# Patient Record
Sex: Male | Born: 1970 | Race: Black or African American | Hispanic: No | Marital: Single
Health system: Southern US, Community
[De-identification: ages and names within clinical notes are randomized; demographics above are authoritative.]

---

## 2003-10-26 ENCOUNTER — Emergency Department (HOSPITAL_COMMUNITY): Admission: EM | Admit: 2003-10-26 | Discharge: 2003-10-26 | Payer: Self-pay | Admitting: Emergency Medicine

## 2003-11-07 ENCOUNTER — Ambulatory Visit (HOSPITAL_COMMUNITY): Admission: RE | Admit: 2003-11-07 | Discharge: 2003-11-07 | Payer: Self-pay | Admitting: Emergency Medicine

## 2007-06-01 ENCOUNTER — Emergency Department (HOSPITAL_COMMUNITY): Admission: EM | Admit: 2007-06-01 | Discharge: 2007-06-01 | Payer: Self-pay | Admitting: General Surgery

## 2008-02-22 IMAGING — CR DG CHEST 2V
2 series · 2 of 2 positions shown · non-contrast
Comparison: none

CLINICAL DATA: Chest pain and short of breath.  Acute pancreatitis.  Hypertension.  
 CHEST - 2 VIEW:

[view not recorded (1 of 2)]
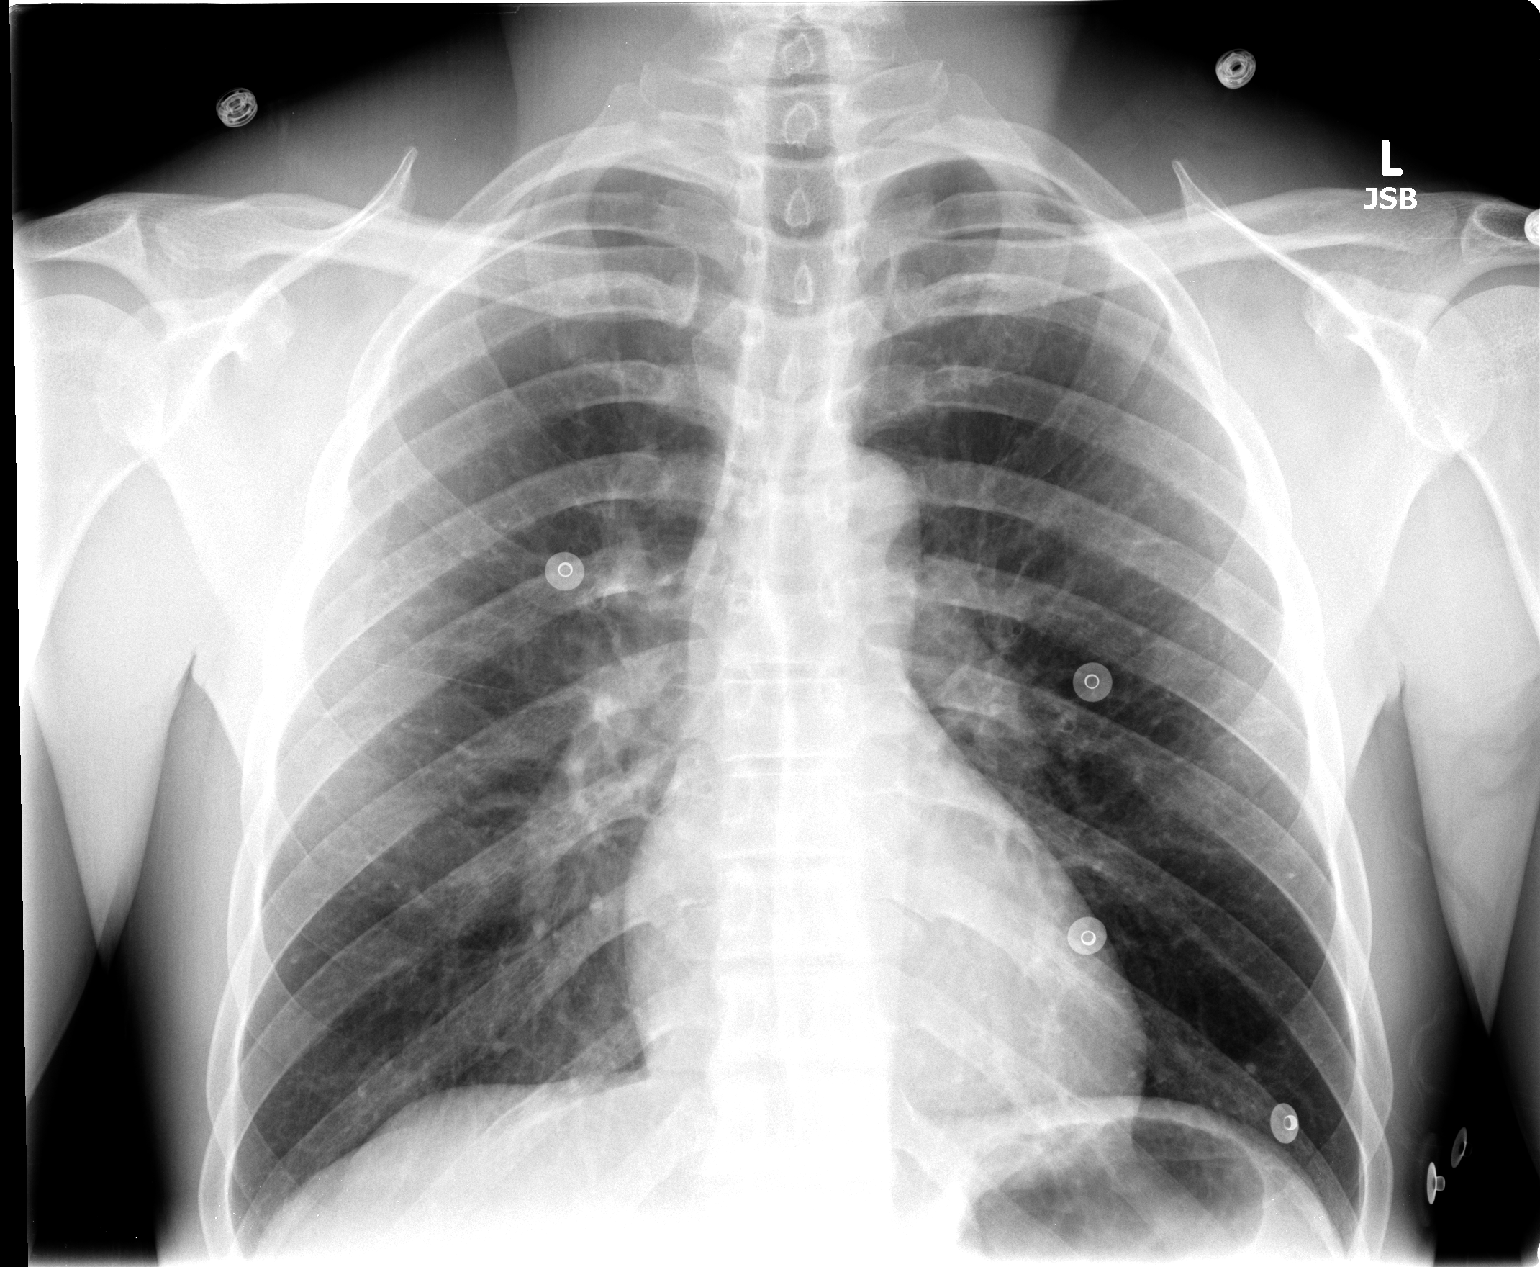

[view not recorded (2 of 2)]
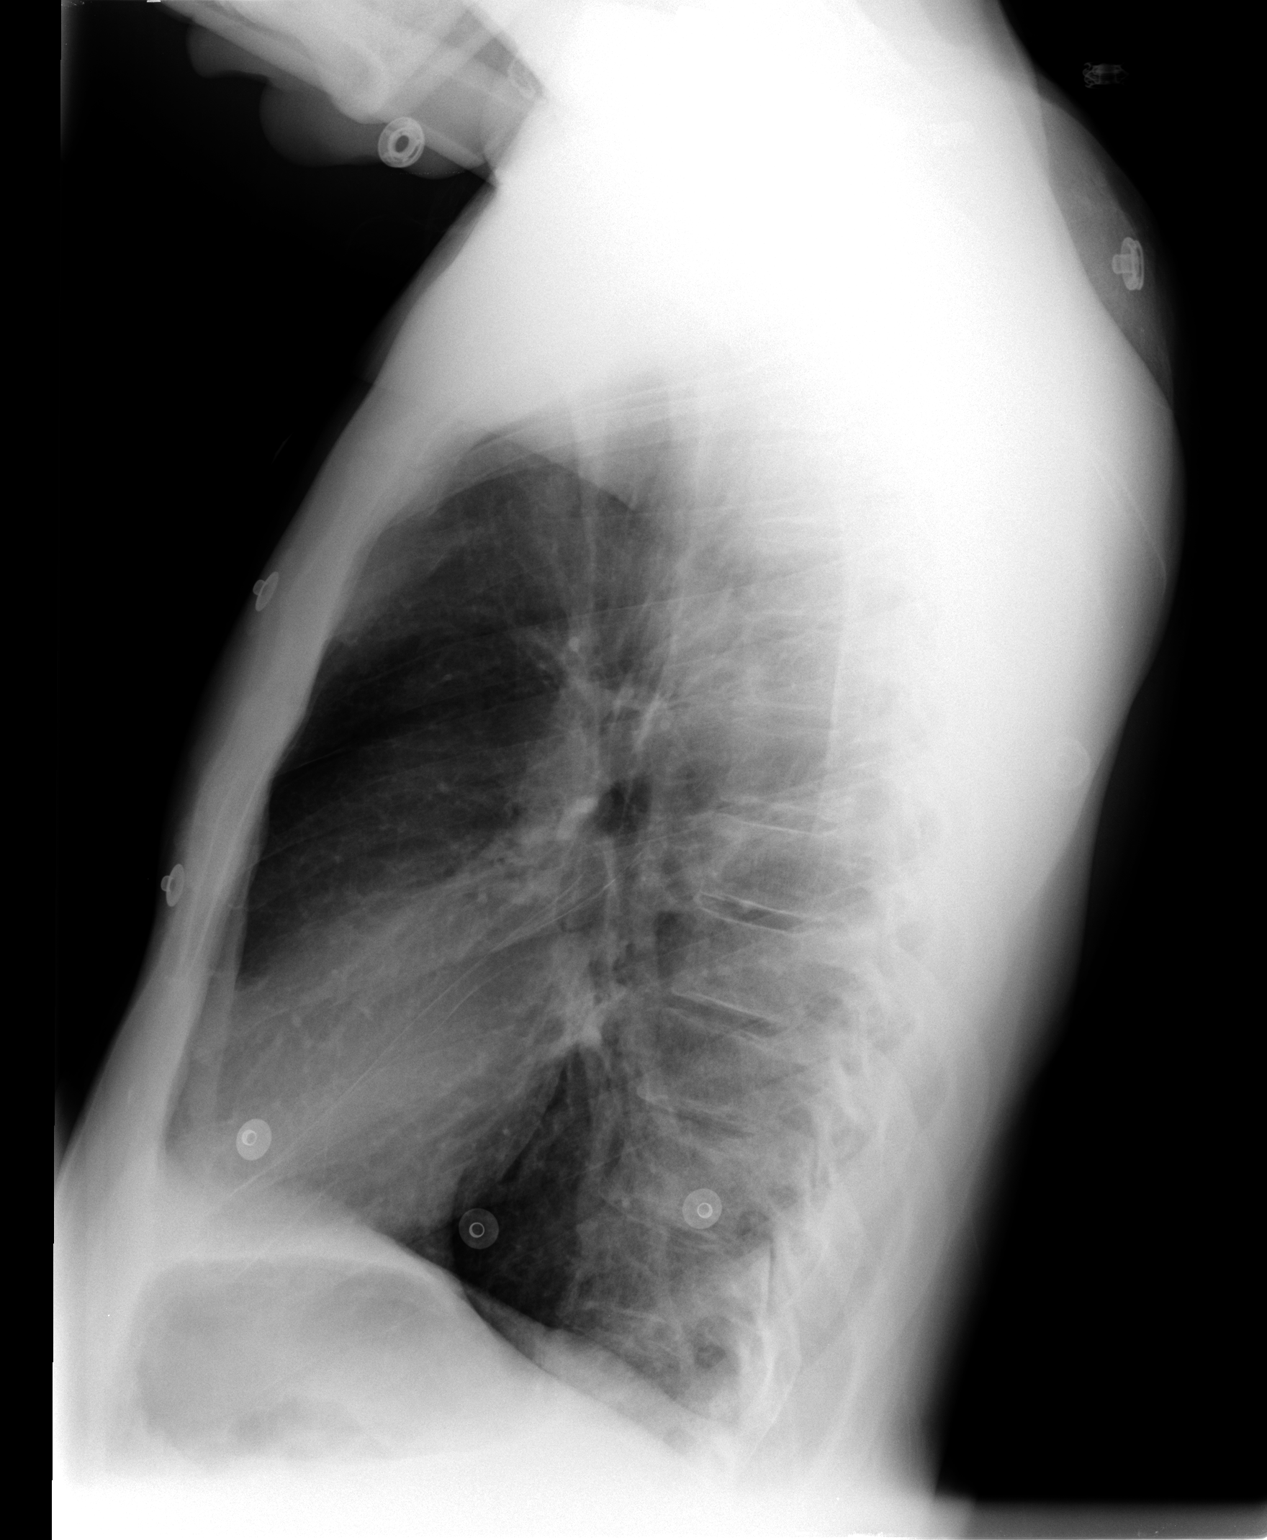

[2 of 2 positions shown; findings below may reference images not displayed]

FINDINGS: Heart size is normal.  The mediastinum is unremarkable.  The lungs are clear.  No effusions.  No soft tissue or bony abnormality is seen.
IMPRESSION: No active disease.

## 2011-01-14 ENCOUNTER — Emergency Department (HOSPITAL_COMMUNITY)
Admission: EM | Admit: 2011-01-14 | Discharge: 2011-01-14 | Disposition: A | Discharge status: Home / Self Care | Payer: Self-pay | Attending: Emergency Medicine | Admitting: Emergency Medicine

## 2011-01-14 DIAGNOSIS — R21 Rash and other nonspecific skin eruption: Secondary | ICD-10-CM | POA: Insufficient documentation

## 2011-01-14 DIAGNOSIS — L02419 Cutaneous abscess of limb, unspecified: Secondary | ICD-10-CM | POA: Insufficient documentation

## 2011-07-05 LAB — POCT CARDIAC MARKERS
Myoglobin, poc: 60.5
Operator id: 151321
Troponin i, poc: <0.05

## 2011-07-05 LAB — I-STAT 8, (EC8 V) (CONVERTED LAB)
Acid-Base Excess: 4 — ABNORMAL HIGH
Bicarbonate: 28.7 — ABNORMAL HIGH
HCT: 42
Hemoglobin: 14.3
Operator id: 151321
Sodium: 140
pCO2, Ven: 42.9 — ABNORMAL LOW

## 2018-10-01 ENCOUNTER — Emergency Department (HOSPITAL_COMMUNITY)
Admission: EM | Admit: 2018-10-01 | Discharge: 2018-10-01 | Disposition: A | Discharge status: Home / Self Care | Payer: Self-pay | Attending: Emergency Medicine | Admitting: Emergency Medicine

## 2018-10-01 ENCOUNTER — Emergency Department (HOSPITAL_COMMUNITY): Payer: Self-pay

## 2018-10-01 ENCOUNTER — Encounter (HOSPITAL_COMMUNITY): Payer: Self-pay | Admitting: Emergency Medicine

## 2018-10-01 DIAGNOSIS — R1013 Epigastric pain: Secondary | ICD-10-CM

## 2018-10-01 DIAGNOSIS — K852 Alcohol induced acute pancreatitis without necrosis or infection: Secondary | ICD-10-CM | POA: Insufficient documentation

## 2018-10-01 LAB — CBC
HEMATOCRIT: 44.5 % (ref 39.0–52.0)
Hemoglobin: 13.8 g/dL (ref 13.0–17.0)
MCH: 26.1 pg (ref 26.0–34.0)
MCHC: 31 g/dL (ref 30.0–36.0)
MCV: 84.1 fL (ref 80.0–100.0)
Platelets: 286 10*3/uL (ref 150–400)
RBC: 5.29 MIL/uL (ref 4.22–5.81)
RDW: 14.8 % (ref 11.5–15.5)
WBC: 8.6 10*3/uL (ref 4.0–10.5)
nRBC: 0 % (ref 0.0–0.2)

## 2018-10-01 LAB — URINALYSIS, ROUTINE W REFLEX MICROSCOPIC
BILIRUBIN URINE: NEGATIVE
GLUCOSE, UA: NEGATIVE mg/dL
HGB URINE DIPSTICK: NEGATIVE
Ketones, ur: NEGATIVE mg/dL
Leukocytes, UA: NEGATIVE
Nitrite: NEGATIVE
PH: 7 (ref 5.0–8.0)
Protein, ur: NEGATIVE mg/dL
Specific Gravity, Urine: 1.021 (ref 1.005–1.030)

## 2018-10-01 LAB — COMPREHENSIVE METABOLIC PANEL
ALK PHOS: 47 U/L (ref 38–126)
ALT: 11 U/L (ref 0–44)
AST: 16 U/L (ref 15–41)
Albumin: 3.5 g/dL (ref 3.5–5.0)
Anion gap: 10 (ref 5–15)
BUN: 17 mg/dL (ref 6–20)
CHLORIDE: 106 mmol/L (ref 98–111)
CO2: 24 mmol/L (ref 22–32)
CREATININE: 1.14 mg/dL (ref 0.61–1.24)
Calcium: 9.2 mg/dL (ref 8.9–10.3)
GFR calc Af Amer: >60 mL/min (ref >60)
Glucose, Bld: 124 mg/dL — ABNORMAL HIGH (ref 70–99)
Potassium: 3.7 mmol/L (ref 3.5–5.1)
SODIUM: 140 mmol/L (ref 135–145)
TOTAL PROTEIN: 6.7 g/dL (ref 6.5–8.1)
Total Bilirubin: 0.6 mg/dL (ref 0.3–1.2)

## 2018-10-01 LAB — LIPASE, BLOOD: LIPASE: 97 U/L — AB (ref 11–51)

## 2018-10-01 LAB — POC OCCULT BLOOD, ED: FECAL OCCULT BLD: NEGATIVE

## 2018-10-01 MED ORDER — FAMOTIDINE 20 MG PO TABS
40.0000 mg | ORAL_TABLET | Freq: Once | ORAL | Status: AC
  Administered 2018-10-01: 40 mg via ORAL
  Filled 2018-10-01: qty 2

## 2018-10-01 MED ORDER — OMEPRAZOLE 20 MG PO CPDR: 20.0000 mg | DELAYED_RELEASE_CAPSULE | Freq: Every day | ORAL | 1 refills | Status: AC

## 2018-10-01 NOTE — Discharge Instructions (Signed)
1.  You must avoid all alcohol when you have pancreatitis.  This is been the most common causes of pancreatitis.  Follow the instructions in your discharge instructions regarding diet. 2.  Take omeprazole (Prilosec) daily for the next month. 3.  It is very important that you schedule a follow-up appointment with a family doctor for reassessment.  You may go to [facility] and wellness and fill out new patient paperwork.  You may call the referral number in your discharge instructions to help find a physician.  You MUST follow-up because it needs to be confirmed that your symptoms are going away and improving with treatment.  You may need further evaluation such as upper endoscopies or CT scans to make sure you do not have severe conditions such as early cancer or ulcers.

## 2018-10-01 NOTE — ED Notes (Signed)
Patient transported to Ultrasound 

## 2018-10-01 NOTE — ED Notes (Signed)
ED Provider at bedside. Per MD no IV at this time

## 2018-10-01 NOTE — ED Triage Notes (Signed)
Pt reports generalized abdominal pain X4 days, that comes and goes.  Pt denies any other symptoms.

## 2018-10-01 NOTE — ED Notes (Signed)
Pt. Back from ultrasound

## 2018-10-01 NOTE — ED Provider Notes (Signed)
[facility] EMERGENCY DEPARTMENT Provider Note   CSN: [id number] Arrival date & time: 10/01/18  0358     History   Chief Complaint Chief Complaint  Patient presents with  . Abdominal Pain    HPI Jose Morris is a 48 y.o. male.  HPI Patient started developing pain 4 days ago.  He reports it has been aching in his central abdomen.  He indicates his epigastric area.  He reports that it seems to get worse in the middle of the night.  No associated vomiting.  No associated diarrhea.  No fevers no chills.  No history of similar pain.  Patient does drink several beers per day.  Patient smokes a half a pack of cigarettes per day.  Occasional marijuana use.  No other medical history.  Family history negative for GI or stomach cancers. History reviewed. No pertinent past medical history.  There are no active problems to display for this patient.   History reviewed. No pertinent surgical history.      Home Medications    Prior to Admission medications   Medication Sig Start Date End Date Taking? Authorizing Provider  omeprazole (PRILOSEC) 20 MG capsule Take 1 capsule (20 mg total) by mouth daily. 10/01/18   Arby Barrette, MD    Family History No family history on file.  Social History Social History   Tobacco Use  . Smoking status: Not on file  Substance Use Topics  . Alcohol use: Not on file  . Drug use: Not on file     Allergies   Patient has no known allergies.   Review of Systems Review of Systems 10 Systems reviewed and are negative for acute change except as noted in the HPI.  Physical Exam Updated Vital Signs BP 127/80   Pulse 76   Temp 98.1 F (36.7 C) (Oral)   Resp 16   Ht 5\' 11"  (1.803 m)   Wt 81.6 kg   SpO2 98%   BMI 25.10 kg/m   Physical Exam Constitutional:      Appearance: He is well-developed.  HENT:     Head: Normocephalic and atraumatic.  Eyes:     Pupils: Pupils are equal, round, and reactive to light.  Neck:      Musculoskeletal: Neck supple.  Cardiovascular:     Rate and Rhythm: Normal rate and regular rhythm.     Heart sounds: Normal heart sounds.  Pulmonary:     Effort: Pulmonary effort is normal.     Breath sounds: Normal breath sounds.  Abdominal:     General: Bowel sounds are normal. There is no distension.     Palpations: Abdomen is soft.     Tenderness: There is no abdominal tenderness.     Comments: Abdomen is soft.  Patient has moderate to severe epigastric and right upper quadrant tenderness to palpation.  No guarding.  No distention.  Lower abdomen soft and nontender.  Genitourinary:    Comments: Rectal exam normal.  No stool in the vault.  No blood.  No appreciable mass in the anal canal or lower rectal vault. Musculoskeletal: Normal range of motion.     Comments: No peripheral edema.  Calves are soft and nontender.  Skin condition and condition of lower legs very good.  Good general muscular tone.  Skin:    General: Skin is warm and dry.  Neurological:     Mental Status: He is alert and oriented to person, place, and time.     GCS: GCS  eye subscore is 4. GCS verbal subscore is 5. GCS motor subscore is 6.     Coordination: Coordination normal.  Psychiatric:        Mood and Affect: Mood normal.      ED Treatments / Results  Labs (all labs ordered are listed, but only abnormal results are displayed) Labs Reviewed  LIPASE, BLOOD - Abnormal; Notable for the following components:      Result Value   Lipase 97 (*)    All other components within normal limits  COMPREHENSIVE METABOLIC PANEL - Abnormal; Notable for the following components:   Glucose, Bld 124 (*)    All other components within normal limits  CBC  URINALYSIS, ROUTINE W REFLEX MICROSCOPIC  POC OCCULT BLOOD, ED    EKG None  Radiology US Abdomen Limited  Result Date: 10/01/2018 CLINICAL DATA:  Right upper quadrant pain. EXAM: ULTRASOUND ABDOMEN LIMITED RIGHT UPPER QUADRANT COMPARISON:  No recent prior.  FINDINGS: Gallbladder: Gallbladder is partially contracted. No stones identified. Gallbladder wall thickness 2.3 mm. Negative Murphy sign. Common bile duct: Diameter: 3.7 mm Liver: Tiny 9.5 mm echogenic focus in the anterior aspect of the liver. Statistically this is most likely a benign hemangioma. If confirmation is needed MRI can be obtained. Within normal limits in parenchymal echogenicity. Portal vein is patent on color Doppler imaging with normal direction of blood flow towards the liver. IMPRESSION: 1. Partially contracted gallbladder. No gallstones or biliary distention. 2. Tiny 9.5 mm echogenic focus in the anterior aspect of liver. Statistically this is most likely a benign hemangioma as discussed above. Electronically Signed   By: Maisie Fus  Register   On: 10/01/2018 09:03    Procedures Procedures (including critical care time)  Medications Ordered in ED Medications  famotidine (PEPCID) tablet 40 mg (40 mg Oral Given 10/01/18 0739)     Initial Impression / Assessment and Plan / ED Course  I have reviewed the triage vital signs and the nursing notes.  Pertinent labs & imaging results that were available during my care of the patient were reviewed by me and considered in my medical decision making (see chart for details).    Patient presents with epigastric pain.  Pain has been present and worsening for several days.  Patient is nontoxic and alert.  He is not in any acute distress.  Abdominal exam is nonsurgical.  He does have tenderness in the epigastrium and right upper quadrant.  Right upper quadrant ultrasound shows no wall thickening or obstructing stones.  Patient has mild elevation in lipase, 97.  Patient does report daily alcohol consumption of a few beers.  He is counseled on necessity to discontinue all alcohol consumption.  He is counseled on a diet for pancreatitis.  Patient is counseled on necessity of close follow-up to make sure that all symptoms are resolving and that there are  not other indolent processes such as gastric or pancreatic cancers.  At this time, based on history of present illness and family history, no immediate reasons to suspect gastric or pancreatic cancer.  Patient will be started on daily omeprazole and dietary instructions.  Final Clinical Impressions(s) / ED Diagnoses   Final diagnoses:  Epigastric pain  Alcohol-induced acute pancreatitis, unspecified complication status    ED Discharge Orders         Ordered    omeprazole (PRILOSEC) 20 MG capsule  Daily     10/01/18 0939           Arby Barrette, MD 10/01/18 [phone]
